# Patient Record
Sex: Female | Born: 1970
Health system: Southern US, Community
[De-identification: ages and names within clinical notes are randomized; demographics above are authoritative.]

## PROBLEM LIST (undated history)

## (undated) DIAGNOSIS — G35 Multiple sclerosis: Secondary | ICD-10-CM

## (undated) HISTORY — PX: CHOLECYSTECTOMY: SHX55

## (undated) HISTORY — PX: TUBAL LIGATION: SHX77

---

## 2016-05-16 ENCOUNTER — Encounter: Payer: Self-pay | Admitting: *Deleted

## 2016-05-16 ENCOUNTER — Emergency Department
Admission: EM | Admit: 2016-05-16 | Discharge: 2016-05-16 | Disposition: A | Discharge status: Home / Self Care | Payer: BLUE CROSS/BLUE SHIELD | Source: Home / Self Care | Attending: Family Medicine | Admitting: Family Medicine

## 2016-05-16 ENCOUNTER — Emergency Department (INDEPENDENT_AMBULATORY_CARE_PROVIDER_SITE_OTHER): Payer: BLUE CROSS/BLUE SHIELD

## 2016-05-16 DIAGNOSIS — S62616A Displaced fracture of proximal phalanx of right little finger, initial encounter for closed fracture: Secondary | ICD-10-CM

## 2016-05-16 DIAGNOSIS — X58XXXA Exposure to other specified factors, initial encounter: Secondary | ICD-10-CM | POA: Diagnosis not present

## 2016-05-16 HISTORY — DX: Multiple sclerosis: G35

## 2016-05-16 NOTE — Discharge Instructions (Signed)
° °  You may have 500mg  acetaminophen (Tylenol) every 4-6 hours (1000mg  for first dose) and ibuprofen 600mg  every 6-8 hours for pain and swelling.  Try to keep hand elevated to help with swelling. You may also apply a cold pack to the splint as the cool temperature can be felt through the splint.  Do NOT get the splint wet.  If it becomes to tight, dirty, or wet, before follow up with Sports Medicine, please call urgent care for assistance.

## 2016-05-16 NOTE — ED Provider Notes (Signed)
CSN: [id number]     Arrival date & time 05/16/16  1506 History   First MD Initiated Contact with Patient 05/16/16 1539     Chief Complaint  Patient presents with  . Finger Injury   (Consider location/radiation/quality/duration/timing/severity/associated sxs/prior Treatment) HPI  Rockwell Alexandriaracy Derrick is a 45 y.o. female presenting to UC with c/o Right little finger pain, swelling, and bruising that started last night after falling due to getting caught up in her dogs leash while walking him.  No other injuries.  Pain is aching and sore, 7/10, worse with palpation and movement. Movement limited due to pain.  She took Tylenol and used ice with minimal relief.  Pt is Right hand dominant.    Past Medical History:  Diagnosis Date  . Multiple sclerosis (HCC)    Past Surgical History:  Procedure Laterality Date  . CHOLECYSTECTOMY    . TUBAL LIGATION     Family History  Problem Relation Age of Onset  . Rheum arthritis Father   . Diverticulitis Father   . COPD Father    Social History  Substance Use Topics  . Smoking status: Never Smoker  . Smokeless tobacco: Never Used  . Alcohol use No   OB History    No data available     Review of Systems  Musculoskeletal: Positive for arthralgias, joint swelling and myalgias.  Skin: Positive for color change. Negative for rash and wound.  Neurological: Positive for numbness. Negative for weakness.    Allergies  Review of patient's allergies indicates no known allergies.  Home Medications   Prior to Admission medications   Not on File   Meds Ordered and Administered this Visit  Medications - No data to display  BP 143/84 (BP Location: Left Arm)   Pulse 74   Temp 98.1 F (36.7 C) (Oral)   Resp 16   Ht 5\' 4"  (1.626 m)   Wt 217 lb (98.4 kg)   LMP 04/26/2016   SpO2 100%   BMI 37.25 kg/m  No data found.   Physical Exam  Constitutional: She is oriented to person, place, and time. She appears well-developed and well-nourished.   HENT:  Head: Normocephalic and atraumatic.  Eyes: EOM are normal.  Neck: Normal range of motion.  Cardiovascular: Normal rate.   Pulses:      Radial pulses are 2+ on the right side.  Pulmonary/Chest: Effort normal.  Musculoskeletal: She exhibits edema and tenderness. She exhibits no deformity.  Right wrist: full ROM, non-tender. Right hand: mild tenderness along 5th metacarpal. Tenderness to proximal 5th phalanx.  Limited ROM, mild to moderate edema over 5th MCP joint. Full ROM fingers 1-4 w/o tenderness.  Neurological: She is alert and oriented to person, place, and time.  Right little finger: slight decreased sensation over proximal phalanx. Sensation in tact at distal phalanx.   Skin: Skin is warm and dry. Capillary refill takes less than 2 seconds.  Right little finger: skin in tact, mild ecchymosis. No erythema or warmth.   Nursing note and vitals reviewed.   Urgent Care Course   Clinical Course    .Splint Application Date/Time: 05/16/2016 5:08 PM Performed by: Junius Finner'MALLEY, Marciana Uplinger Authorized by: Donna ChristenBEESE, STEPHEN A   Consent:    Consent obtained:  Verbal   Consent given by:  Patient   Risks discussed:  Discoloration, numbness, pain and swelling   Alternatives discussed:  Delayed treatment and alternative treatment Pre-procedure details:    Sensation:  Numbness and weakness (slight decreased sensation at proximal phalanx, limited ROM  at 5ht MCP. Senation normal to distal phalanx.)   Skin color:  Skin in tact, ecchymosis to proximal aspect 5th phalanx.  Procedure details:    Laterality:  Right   Location:  Finger   Finger:  R small finger   Strapping: no     Splint type:  Ulnar gutter   Supplies:  Cotton padding, Ortho-Glass and elastic bandage Post-procedure details:    Pain:  Unchanged   Sensation:  Normal   Skin color:  Pink, dry   Patient tolerance of procedure:  Tolerated well, no immediate complications   (including critical care time)  Labs Review Labs Reviewed  - No data to display  Imaging Review Dg Hand Complete Right  Result Date: 05/16/2016 CLINICAL DATA:  Fall 1 day prior EXAM: RIGHT HAND - COMPLETE 3+ VIEW COMPARISON:  None. FINDINGS: Frontal, oblique, and lateral views were obtained. There is an obliquely oriented fracture involving the mid the distal aspects of the fifth proximal phalanx with overall alignment near anatomic. There is soft tissue swelling in this area. No other fractures are evident. No dislocation. Joint spaces appear normal. IMPRESSION: Obliquely oriented fracture mid to distal aspects of the fifth proximal phalanx with alignment overall near anatomic. No other fractures. No dislocations. No apparent arthropathy. Electronically Signed   By: Bretta Bang III M.D.   On: 05/16/2016 15:41    MDM   1. Closed fracture of proximal phalanx of fifth finger of right hand, initial encounter    Pt c/o Right little finger pain, swelling, and bruising after a fall yesterday. No other injuries.  Plain films: significant for fracture mid to distal aspect of fifth proximal phalanx. Non-displaced.  Pt placed in ulnar-gutter splint, home care instructions provided. Encouraged to call Sports Medicine on Monday to schedule f/u appointment in 1-2 weeks for recheck of fracture. Pt declined prescription pain medication at this time.  Patient verbalized understanding and agreement with treatment plan.    Junius Finner, PA-C 05/16/16 1710

## 2016-05-16 NOTE — ED Triage Notes (Signed)
Pt c/o RT 5th finger pain post fall at home yesterday. She took Tylenol and applied ice. Offered tylenol or IBF for pain pt declined. She said she would take some when she got home.

## 2016-05-23 ENCOUNTER — Ambulatory Visit (INDEPENDENT_AMBULATORY_CARE_PROVIDER_SITE_OTHER): Payer: BLUE CROSS/BLUE SHIELD

## 2016-05-23 ENCOUNTER — Ambulatory Visit (INDEPENDENT_AMBULATORY_CARE_PROVIDER_SITE_OTHER): Payer: BLUE CROSS/BLUE SHIELD | Admitting: Family Medicine

## 2016-05-23 ENCOUNTER — Encounter: Payer: Self-pay | Admitting: Family Medicine

## 2016-05-23 VITALS — BP 142/70 | HR 75 | Wt 219.0 lb

## 2016-05-23 DIAGNOSIS — W010XXD Fall on same level from slipping, tripping and stumbling without subsequent striking against object, subsequent encounter: Secondary | ICD-10-CM | POA: Diagnosis not present

## 2016-05-23 DIAGNOSIS — Z23 Encounter for immunization: Secondary | ICD-10-CM

## 2016-05-23 DIAGNOSIS — S62616D Displaced fracture of proximal phalanx of right little finger, subsequent encounter for fracture with routine healing: Secondary | ICD-10-CM

## 2016-05-23 DIAGNOSIS — S62609A Fracture of unspecified phalanx of unspecified finger, initial encounter for closed fracture: Secondary | ICD-10-CM | POA: Insufficient documentation

## 2016-05-23 NOTE — Patient Instructions (Signed)
Thank you for coming in today. Return in 2 weeks for recheck.   Cast or Splint Care Casts and splints support injured limbs and keep bones from moving while they heal. It is important to care for your cast or splint at home.  HOME CARE INSTRUCTIONS  Keep the cast or splint uncovered during the drying period. It can take 24 to 48 hours to dry if it is made of plaster. A fiberglass cast will dry in less than 1 hour.  Do not rest the cast on anything harder than a pillow for the first 24 hours.  Do not put weight on your injured limb or apply pressure to the cast until your health care provider gives you permission.  Keep the cast or splint dry. Wet casts or splints can lose their shape and may not support the limb as well. A wet cast that has lost its shape can also create harmful pressure on your skin when it dries. Also, wet skin can become infected.  Cover the cast or splint with a plastic bag when bathing or when out in the rain or snow. If the cast is on the trunk of the body, take sponge baths until the cast is removed.  If your cast does become wet, dry it with a towel or a blow dryer on the cool setting only.  Keep your cast or splint clean. Soiled casts may be wiped with a moistened cloth.  Do not place any hard or soft foreign objects under your cast or splint, such as cotton, toilet paper, lotion, or powder.  Do not try to scratch the skin under the cast with any object. The object could get stuck inside the cast. Also, scratching could lead to an infection. If itching is a problem, use a blow dryer on a cool setting to relieve discomfort.  Do not trim or cut your cast or remove padding from inside of it.  Exercise all joints next to the injury that are not immobilized by the cast or splint. For example, if you have a long leg cast, exercise the hip joint and toes. If you have an arm cast or splint, exercise the shoulder, elbow, thumb, and fingers.  Elevate your injured arm or  leg on 1 or 2 pillows for the first 1 to 3 days to decrease swelling and pain.It is best if you can comfortably elevate your cast so it is higher than your heart. SEEK MEDICAL CARE IF:   Your cast or splint cracks.  Your cast or splint is too tight or too loose.  You have unbearable itching inside the cast.  Your cast becomes wet or develops a soft spot or area.  You have a bad smell coming from inside your cast.  You get an object stuck under your cast.  Your skin around the cast becomes red or raw.  You have new pain or worsening pain after the cast has been applied. SEEK IMMEDIATE MEDICAL CARE IF:   You have fluid leaking through the cast.  You are unable to move your fingers or toes.  You have discolored (blue or white), cool, painful, or very swollen fingers or toes beyond the cast.  You have tingling or numbness around the injured area.  You have severe pain or pressure under the cast.  You have any difficulty with your breathing or have shortness of breath.  You have chest pain.   This information is not intended to replace advice given to you by your health   care provider. Make sure you discuss any questions you have with your health care provider.   Document Released: 09/05/2000 Document Revised: 06/29/2013 Document Reviewed: 03/17/2013 Elsevier Interactive Patient Education 2016 Elsevier Inc.  

## 2016-05-23 NOTE — Progress Notes (Signed)
   Subjective:    I'm seeing this patient as a consultation for:  Junius Finnerrin O'Malley PA-C  CC: Right fifth finger fracture  HPI: Patient fell on August 24 injuring her right hand. She was seen in urgent care on the 25th diagnosed with a proximal right fifth phalanx fracture and treated with ulnar gutter splinting. She notes mild pain but overall is doing quite well with no fevers chills nausea vomiting or diarrhea.  Past medical history, Surgical history, Family history not pertinant except as noted below, Social history, Allergies, and medications have been entered into the medical record, reviewed, and no changes needed.   Review of Systems: No headache, visual changes, nausea, vomiting, diarrhea, constipation, dizziness, abdominal pain, skin rash, fevers, chills, night sweats, weight loss, swollen lymph nodes, body aches, joint swelling, muscle aches, chest pain, shortness of breath, mood changes, visual or auditory hallucinations.   Objective:    Vitals:   05/23/16 0927  BP: (!) 142/70  Pulse: 75   General: Well Developed, well nourished, and in no acute distress.  Neuro/Psych: Alert and oriented x3, extra-ocular muscles intact, able to move all 4 extremities, sensation grossly intact. Skin: Warm and dry, no rashes noted.  Respiratory: Not using accessory muscles, speaking in full sentences, trachea midline.  Cardiovascular: Pulses palpable, no extremity edema. Abdomen: Does not appear distended. MSK: Right hand: Normal-appearing no significant ecchymosis. Pulses capillary refill and sensation intact distally. No  deformities noted.  X-ray right fifth digit: Stable appearance oblique fracture. No displacement present. Awaiting formal radiology reviewed  Well-formed ulnar gutter cast applied  No results found for this or any previous visit (from the past 24 hour(s)). No results found.  Impression and Recommendations:    Assessment and Plan: 45 y.o. female with Right fifth digit  fracture.   Casted today. Return in 2 weeks for recheck and reevaluation.  Patient was given a flu vaccine prior to discharge.   Discussed warning signs or symptoms. Please see discharge instructions. Patient expresses understanding.

## 2016-06-06 ENCOUNTER — Ambulatory Visit (INDEPENDENT_AMBULATORY_CARE_PROVIDER_SITE_OTHER): Payer: BLUE CROSS/BLUE SHIELD

## 2016-06-06 ENCOUNTER — Ambulatory Visit (INDEPENDENT_AMBULATORY_CARE_PROVIDER_SITE_OTHER): Payer: BLUE CROSS/BLUE SHIELD | Admitting: Family Medicine

## 2016-06-06 ENCOUNTER — Encounter: Payer: Self-pay | Admitting: Family Medicine

## 2016-06-06 VITALS — BP 136/75 | HR 74 | Wt 217.0 lb

## 2016-06-06 DIAGNOSIS — S62609A Fracture of unspecified phalanx of unspecified finger, initial encounter for closed fracture: Secondary | ICD-10-CM | POA: Diagnosis not present

## 2016-06-06 DIAGNOSIS — W19XXXD Unspecified fall, subsequent encounter: Secondary | ICD-10-CM | POA: Diagnosis not present

## 2016-06-06 DIAGNOSIS — S62616D Displaced fracture of proximal phalanx of right little finger, subsequent encounter for fracture with routine healing: Secondary | ICD-10-CM

## 2016-06-06 NOTE — Progress Notes (Signed)
       Ashlee Lyons is a 45 y.o. female who [facility] today for follow-up right fifth digit fracture. Patient was originally seen on August 25 for fracture of the right fifth digit and urgent care. She was placed into a splint and subsequently into a fiberglass cast with me on September 1. In the interim she has done well with minimal pain.   Past Medical History:  Diagnosis Date  . Multiple sclerosis (HCC)    Past Surgical History:  Procedure Laterality Date  . CHOLECYSTECTOMY    . TUBAL LIGATION     Social History  Substance Use Topics  . Smoking status: Never Smoker  . Smokeless tobacco: Never Used  . Alcohol use No   family history includes COPD in her father; Diverticulitis in her father; Rheum arthritis in her father.  ROS as above:  Medications: Current Outpatient Prescriptions  Medication Sig Dispense Refill  . Acetaminophen (TYLENOL) 325 MG CAPS Take by mouth.     No current facility-administered medications for this visit.    No Known Allergies   Exam:  BP 136/75   Pulse 74   Wt 217 lb (98.4 kg)   LMP 04/26/2016   BMI 37.25 kg/m  Gen: Well NAD Right hand: Well appearing non-tender. No rotational deformity seen.   Xray right 5th digit: Slight worsening displacement of the fracture segment compared to previous x-rays. Otherwise well appearing. Awaiting formal radiology review  Patient was placed back into a ulnar gutter cast  No results found for this or any previous visit (from the past 24 hour(s)). No results found.    Assessment and Plan: 45 y.o. female with slightly worsening displaced fifth finger fracture. Continue conservative management and recheck in 1 week. If worsening we will send to hand surgery for evaluation of possible fixation.   Orders Placed This Encounter  Procedures  . DG Finger Little Right   Order Specific Question:   Reason for exam:    Answer:   f/u fx    Order Specific Question:   Is the patient pregnant?    Answer:   No    Order Specific Question:   Preferred imaging location?    [facility]    Discussed warning signs or symptoms. Please see discharge instructions. Patient expresses understanding.

## 2016-06-06 NOTE — Patient Instructions (Signed)
Thank you for coming in today. Return in 1 week.   Cast or Splint Care Casts and splints support injured limbs and keep bones from moving while they heal. It is important to care for your cast or splint at home.  HOME CARE INSTRUCTIONS  Keep the cast or splint uncovered during the drying period. It can take 24 to 48 hours to dry if it is made of plaster. A fiberglass cast will dry in less than 1 hour.  Do not rest the cast on anything harder than a pillow for the first 24 hours.  Do not put weight on your injured limb or apply pressure to the cast until your health care provider gives you permission.  Keep the cast or splint dry. Wet casts or splints can lose their shape and may not support the limb as well. A wet cast that has lost its shape can also create harmful pressure on your skin when it dries. Also, wet skin can become infected.  Cover the cast or splint with a plastic bag when bathing or when out in the rain or snow. If the cast is on the trunk of the body, take sponge baths until the cast is removed.  If your cast does become wet, dry it with a towel or a blow dryer on the cool setting only.  Keep your cast or splint clean. Soiled casts may be wiped with a moistened cloth.  Do not place any hard or soft foreign objects under your cast or splint, such as cotton, toilet paper, lotion, or powder.  Do not try to scratch the skin under the cast with any object. The object could get stuck inside the cast. Also, scratching could lead to an infection. If itching is a problem, use a blow dryer on a cool setting to relieve discomfort.  Do not trim or cut your cast or remove padding from inside of it.  Exercise all joints next to the injury that are not immobilized by the cast or splint. For example, if you have a long leg cast, exercise the hip joint and toes. If you have an arm cast or splint, exercise the shoulder, elbow, thumb, and fingers.  Elevate your injured arm or leg on 1 or 2  pillows for the first 1 to 3 days to decrease swelling and pain.It is best if you can comfortably elevate your cast so it is higher than your heart. SEEK MEDICAL CARE IF:   Your cast or splint cracks.  Your cast or splint is too tight or too loose.  You have unbearable itching inside the cast.  Your cast becomes wet or develops a soft spot or area.  You have a bad smell coming from inside your cast.  You get an object stuck under your cast.  Your skin around the cast becomes red or raw.  You have new pain or worsening pain after the cast has been applied. SEEK IMMEDIATE MEDICAL CARE IF:   You have fluid leaking through the cast.  You are unable to move your fingers or toes.  You have discolored (blue or white), cool, painful, or very swollen fingers or toes beyond the cast.  You have tingling or numbness around the injured area.  You have severe pain or pressure under the cast.  You have any difficulty with your breathing or have shortness of breath.  You have chest pain.   This information is not intended to replace advice given to you by your health care provider.   Make sure you discuss any questions you have with your health care provider.   Document Released: 09/05/2000 Document Revised: 06/29/2013 Document Reviewed: 03/17/2013 Elsevier Interactive Patient Education 2016 Elsevier Inc.  

## 2016-06-13 ENCOUNTER — Ambulatory Visit (INDEPENDENT_AMBULATORY_CARE_PROVIDER_SITE_OTHER): Payer: BLUE CROSS/BLUE SHIELD | Admitting: Family Medicine

## 2016-06-13 ENCOUNTER — Encounter: Payer: Self-pay | Admitting: Family Medicine

## 2016-06-13 ENCOUNTER — Ambulatory Visit (INDEPENDENT_AMBULATORY_CARE_PROVIDER_SITE_OTHER): Payer: BLUE CROSS/BLUE SHIELD

## 2016-06-13 VITALS — BP 130/84 | HR 72 | Wt 216.0 lb

## 2016-06-13 DIAGNOSIS — S62616D Displaced fracture of proximal phalanx of right little finger, subsequent encounter for fracture with routine healing: Secondary | ICD-10-CM

## 2016-06-13 DIAGNOSIS — W19XXXD Unspecified fall, subsequent encounter: Secondary | ICD-10-CM

## 2016-06-13 DIAGNOSIS — S62609A Fracture of unspecified phalanx of unspecified finger, initial encounter for closed fracture: Secondary | ICD-10-CM | POA: Diagnosis not present

## 2016-06-13 NOTE — Patient Instructions (Signed)
Thank you for coming in today. Return in 1 1/2 weeks.

## 2016-06-13 NOTE — Progress Notes (Signed)
       Ashlee Lyons is a 45 y.o. female who [facility] today for a fracture. Patient returns to clinic to follow-up right finger fracture. She's done well over the last week with no pain.   Past Medical History:  Diagnosis Date  . Multiple sclerosis (HCC)    Past Surgical History:  Procedure Laterality Date  . CHOLECYSTECTOMY    . TUBAL LIGATION     Social History  Substance Use Topics  . Smoking status: Never Smoker  . Smokeless tobacco: Never Used  . Alcohol use No   family history includes COPD in her father; Diverticulitis in her father; Rheum arthritis in her father.  ROS as above:  Medications: Current Outpatient Prescriptions  Medication Sig Dispense Refill  . Acetaminophen (TYLENOL) 325 MG CAPS Take by mouth.     No current facility-administered medications for this visit.    No Known Allergies   Exam:  BP 130/84   Pulse 72   Wt 216 lb (98 kg)   LMP 04/26/2016   BMI 37.08 kg/m  Gen: Well NAD Right hand well-appearing no ecchymosis minimally tender right fifth digit.   A well-formed on her daughter cast was placed  No results found for this or any previous visit (from the past 24 hour(s)). Dg Finger Little Right  Result Date: 06/13/2016 CLINICAL DATA:  Followup RIGHT little finger fracture EXAM: RIGHT LITTLE FINGER 2+V COMPARISON:  06/06/2016 FINDINGS: Osseous mineralization normal. Soft tissue swelling RIGHT little finger. Oblique fracture through head and distal shaft of proximal phalanx RIGHT little finger, minimally displaced. This extends to the articular margin at the PIP joint. Minimal periosteal new bone no identified. No additional fracture, dislocation, or bone destruction. IMPRESSION: Healing fracture at proximal phalanx RIGHT little finger as above. Electronically Signed   By: Ulyses SouthwardMark  Boles M.D.   On: 06/13/2016 12:39       Assessment and Plan: 45 y.o. female with healing minimally displaced fracture of the right fifth proximal phalanx. No further displacement in the interval. Continue ulnar gutter casting. Recheck in a week and half.   Orders Placed This Encounter  Procedures  . DG Finger Little Right    Order Specific Question:   Reason for exam:    Answer:   eval fx    Order Specific Question:   Is the patient pregnant?    Answer:   No    Order Specific Question:   Preferred imaging location?    [facility]    Discussed warning signs or symptoms. Please see discharge instructions. Patient expresses understanding.

## 2016-06-17 ENCOUNTER — Ambulatory Visit (INDEPENDENT_AMBULATORY_CARE_PROVIDER_SITE_OTHER): Payer: BLUE CROSS/BLUE SHIELD | Admitting: Family Medicine

## 2016-06-17 ENCOUNTER — Encounter: Payer: Self-pay | Admitting: Family Medicine

## 2016-06-17 VITALS — BP 121/67 | HR 75 | Wt 214.0 lb

## 2016-06-17 DIAGNOSIS — Z4789 Encounter for other orthopedic aftercare: Secondary | ICD-10-CM

## 2016-06-17 DIAGNOSIS — S62609A Fracture of unspecified phalanx of unspecified finger, initial encounter for closed fracture: Secondary | ICD-10-CM

## 2016-06-17 NOTE — Patient Instructions (Signed)
Thank you for coming in today.   Cast or Splint Care Casts and splints support injured limbs and keep bones from moving while they heal.  HOME CARE  Keep the cast or splint uncovered during the drying period.  A plaster cast can take 24 to 48 hours to dry.  A fiberglass cast will dry in less than 1 hour.  Do not rest the cast on anything harder than a pillow for 24 hours.  Do not put weight on your injured limb. Do not put pressure on the cast. Wait for your doctor's approval.  Keep the cast or splint dry.  Cover the cast or splint with a plastic bag during baths or wet weather.  If you have a cast over your chest and belly (trunk), take sponge baths until the cast is taken off.  If your cast gets wet, dry it with a towel or blow dryer. Use the cool setting on the blow dryer.  Keep your cast or splint clean. Wash a dirty cast with a damp cloth.  Do not put any objects under your cast or splint.  Do not scratch the skin under the cast with an object. If itching is a problem, use a blow dryer on a cool setting over the itchy area.  Do not trim or cut your cast.  Do not take out the padding from inside your cast.  Exercise your joints near the cast as told by your doctor.  Raise (elevate) your injured limb on 1 or 2 pillows for the first 1 to 3 days. GET HELP IF:  Your cast or splint cracks.  Your cast or splint is too tight or too loose.  You itch badly under the cast.  Your cast gets wet or has a soft spot.  You have a bad smell coming from the cast.  You get an object stuck under the cast.  Your skin around the cast becomes red or sore.  You have new or more pain after the cast is put on. GET HELP RIGHT AWAY IF:  You have fluid leaking through the cast.  You cannot move your fingers or toes.  Your fingers or toes turn blue or white or are cool, painful, or puffy (swollen).  You have tingling or lose feeling (numbness) around the injured area.  You have  bad pain or pressure under the cast.  You have trouble breathing or have shortness of breath.  You have chest pain.   This information is not intended to replace advice given to you by your health care provider. Make sure you discuss any questions you have with your health care provider.   Document Released: 01/08/2011 Document Revised: 05/11/2013 Document Reviewed: 03/17/2013 Elsevier Interactive Patient Education [employer]2016 Elsevier Inc.

## 2016-06-17 NOTE — Progress Notes (Signed)
       Ashlee Lyons is a 45 y.o. female who [facility] today for cast pain. Patient is currently being treated with right-sided fifth digit fracture. A cast was placed on September 22. She notes the cast is irritating her and causing pain.   Past Medical History:  Diagnosis Date  . Multiple sclerosis (HCC)    Past Surgical History:  Procedure Laterality Date  . CHOLECYSTECTOMY    . TUBAL LIGATION     Social History  Substance Use Topics  . Smoking status: Never Smoker  . Smokeless tobacco: Never Used  . Alcohol use No   family history includes COPD in her father; Diverticulitis in her father; Rheum arthritis in her father.  ROS as above:  Medications: Current Outpatient Prescriptions  Medication Sig Dispense Refill  . Acetaminophen (TYLENOL) 325 MG CAPS Take by mouth.     No current facility-administered medications for this visit.    No Known Allergies   Exam:  BP 121/67   Pulse 75   Wt 214 lb (97.1 kg)   BMI 36.73 kg/m  Gen: Well NAD Right arm: Slight skin erythema at the proximal forearm where it is being rubbed by a cast edge.  Otherwise no skin changes. Arm and hand are nontender.  The ulnar gutter cast was removed and replaced with a new ulnar gutter cast  No results found for this or any previous visit (from the past 24 hour(s)). No results found.    Assessment and Plan: 45 y.o. female with asked irritation. Continue casting. Recheck in about a week and a half.   No orders of the defined types were placed in this encounter.   Discussed warning signs or symptoms. Please see discharge instructions. Patient expresses understanding.

## 2016-06-25 ENCOUNTER — Ambulatory Visit (INDEPENDENT_AMBULATORY_CARE_PROVIDER_SITE_OTHER): Payer: BLUE CROSS/BLUE SHIELD | Admitting: Family Medicine

## 2016-06-25 ENCOUNTER — Ambulatory Visit (INDEPENDENT_AMBULATORY_CARE_PROVIDER_SITE_OTHER): Payer: BLUE CROSS/BLUE SHIELD

## 2016-06-25 VITALS — BP 136/87 | HR 82

## 2016-06-25 DIAGNOSIS — S62646D Nondisplaced fracture of proximal phalanx of right little finger, subsequent encounter for fracture with routine healing: Secondary | ICD-10-CM

## 2016-06-25 DIAGNOSIS — S62646A Nondisplaced fracture of proximal phalanx of right little finger, initial encounter for closed fracture: Secondary | ICD-10-CM

## 2016-06-25 DIAGNOSIS — X58XXXD Exposure to other specified factors, subsequent encounter: Secondary | ICD-10-CM

## 2016-06-25 NOTE — Patient Instructions (Signed)
Thank you for coming in today. Get labs to check calcium and vitamin D.  Return in 2 weeks or sooner if needed.

## 2016-06-26 LAB — COMPLETE METABOLIC PANEL WITH GFR
ALT: 13 U/L (ref 6–29)
AST: 13 U/L (ref 10–35)
Albumin: 4.1 g/dL (ref 3.6–5.1)
Alkaline Phosphatase: 67 U/L (ref 33–115)
BUN: 11 mg/dL (ref 7–25)
CHLORIDE: 103 mmol/L (ref 98–110)
CO2: 23 mmol/L (ref 20–31)
CREATININE: 0.87 mg/dL (ref 0.50–1.10)
Calcium: 9.2 mg/dL (ref 8.6–10.2)
GFR, Est Non African American: 80 mL/min (ref >=60)
Glucose, Bld: 86 mg/dL (ref 65–99)
Potassium: 4.4 mmol/L (ref 3.5–5.3)
Sodium: 137 mmol/L (ref 135–146)
Total Bilirubin: 0.5 mg/dL (ref 0.2–1.2)
Total Protein: 6.7 g/dL (ref 6.1–8.1)

## 2016-06-26 NOTE — Progress Notes (Signed)
       Ashlee Lyons is a 45 y.o. female who [facility] today for follow-up fifth finger fracture. Patient has been immobilized adequately now for about 4 weeks. She denies any pain. She feels well no fevers or chills.   Past Medical History:  Diagnosis Date  . Multiple sclerosis (HCC)    Past Surgical History:  Procedure Laterality Date  . CHOLECYSTECTOMY    . TUBAL LIGATION     Social History  Substance Use Topics  . Smoking status: Never Smoker  . Smokeless tobacco: Never Used  . Alcohol use No   family history includes COPD in her father; Diverticulitis in her father; Rheum arthritis in her father.  ROS as above:  Medications: Current Outpatient Prescriptions  Medication Sig Dispense Refill  . Acetaminophen (TYLENOL) 325 MG CAPS Take by mouth.     No current facility-administered medications for this visit.    No Known Allergies   Exam:  BP 136/87   Pulse 82   LMP 06/11/2016  Gen: Well NAD Right hand: Well-appearing was no deformities fifth digit. Nontender.  X-ray right fifth digit minimal healing present on x-ray. No further angulation or deformity present. Awaiting formal radiology review  Short arm ulnar gutter cast was applied.   No results found for this or any previous visit (from the past 24 hour(s)). No results found.    Assessment and Plan: 45 y.o. female with right fifth digit proximal phalanx fracture. Clinically doing pretty well but not adequately heal on x-ray. Patient was not adequately mobilized the first week of her fracture I believe. She's only been immobilized truly for about 4 weeks. Plan to recast ulnar gutter cast and recheck in 2 weeks.   Orders Placed This Encounter  Procedures  . DG Finger Little Right    Order Specific Question:   Reason for exam:    Answer:   f/u fx    Order Specific Question:    Is the patient pregnant?    Answer:   No    Order Specific Question:   Preferred imaging location?    [facility]  . COMPLETE METABOLIC PANEL WITH GFR  . VITAMIN D 25 Hydroxy (Vit-D Deficiency, Fractures)    Discussed warning signs or symptoms. Please see discharge instructions. Patient expresses understanding.

## 2016-06-27 LAB — VITAMIN D 25 HYDROXY (VIT D DEFICIENCY, FRACTURES): Vit D, 25-Hydroxy: 23 ng/mL — ABNORMAL LOW (ref 30–100)

## 2016-07-09 ENCOUNTER — Ambulatory Visit (INDEPENDENT_AMBULATORY_CARE_PROVIDER_SITE_OTHER): Payer: BLUE CROSS/BLUE SHIELD | Admitting: Family Medicine

## 2016-07-09 ENCOUNTER — Ambulatory Visit (INDEPENDENT_AMBULATORY_CARE_PROVIDER_SITE_OTHER): Payer: BLUE CROSS/BLUE SHIELD

## 2016-07-09 ENCOUNTER — Encounter: Payer: Self-pay | Admitting: Family Medicine

## 2016-07-09 VITALS — BP 144/96 | HR 92 | Wt 215.0 lb

## 2016-07-09 DIAGNOSIS — S62616D Displaced fracture of proximal phalanx of right little finger, subsequent encounter for fracture with routine healing: Secondary | ICD-10-CM | POA: Diagnosis not present

## 2016-07-09 DIAGNOSIS — W19XXXD Unspecified fall, subsequent encounter: Secondary | ICD-10-CM

## 2016-07-09 DIAGNOSIS — S62646A Nondisplaced fracture of proximal phalanx of right little finger, initial encounter for closed fracture: Secondary | ICD-10-CM

## 2016-07-09 NOTE — Patient Instructions (Signed)
Thank you for coming in today. Continue buddy tape.  Return in 3 weeks for recheck.  Return sooner if worse pain.   Buddy Taping You have a minor finger or toe injury. It can be managed by buddy taping. Buddy taping means the injured finger or toe is taped to a healthy uninjured adjacent finger or toe. Most minor fractures and dislocations of the smaller fingers and toes will heal in 3 to 4 weeks. Buddy taping immobilizes and protects the area of injury. Buddy taping is not recommended for initial treatment of fractures of the thumb, longer fingers, or the great toe. Buddy taping should not be used for unstable or deformed fractures, but as fracture healing progresses it may be used for protection during rehabilitation. Fractured fingers and toes should be protected by buddy taping as long as the injury is still painful or swollen.  When an injury is buddy taped, place a small piece of gauze or cotton between the digits that are taped. This helps prevent the skin from breaking down from increased moisture. Buddy taping allows you to get your injury wet when you bathe. Change the gauze and tape more often if it gets wet, and dry the space between the finger or toes. Use a sturdy, hard-soled shoe for better support if you have a fractured toe. In 2 to 3 weeks you can start motion exercises. This will keep the fingers or toes from becoming stiff.  SEEK IMMEDIATE MEDICAL CARE IF:   The injured area becomes cold, numb, or pale.  You have pain not controlled with medications.  You notice increasing deformity of the toe or finger.   This information is not intended to replace advice given to you by your health care provider. Make sure you discuss any questions you have with your health care provider.   Document Released: 10/16/2004 Document Revised: 09/29/2014 Document Reviewed: 01/31/2015 Elsevier Interactive Patient Education [employer]2016 Elsevier Inc.

## 2016-07-10 NOTE — Progress Notes (Signed)
   Rockwell Alexandriaracy Scheier is a 45 y.o. female who [facility] today for follow-up right finger fracture. Patient was originally seen in late August for fracture of her right fifth digit at the PIP. She's been immobilized since with ulnar gutter splint and casting. She denies any pain but does note some stiffness. No fevers or chills.   Past Medical History:  Diagnosis Date  . Multiple sclerosis (HCC)    Past Surgical History:  Procedure Laterality Date  . CHOLECYSTECTOMY    . TUBAL LIGATION     Social History  Substance Use Topics  . Smoking status: Never Smoker  . Smokeless tobacco: Never Used  . Alcohol use No     ROS:  As above   Medications: Current Outpatient Prescriptions  Medication Sig Dispense Refill  . Acetaminophen (TYLENOL) 325 MG CAPS Take by mouth.    Marland Kitchen. VITAMIN D, CHOLECALCIFEROL, PO Take by mouth.     No current facility-administered medications for this visit.    No Known Allergies   Exam:  BP (!) 144/96   Pulse 92   Wt 215 lb (97.5 kg)   LMP 07/01/2016   BMI 36.90 kg/m  General: Well Developed, well nourished, and in no acute distress.  Neuro/Psych: Alert and oriented x3, extra-ocular muscles intact, able to move all 4 extremities, sensation grossly intact. Skin: Warm and dry, no rashes noted.  Respiratory: Not using accessory muscles, speaking in full sentences, trachea midline.  Cardiovascular: Pulses palpable, no extremity edema. Abdomen: Does not appear distended. MSK: Right hand is well-appearing with no deformity. Nontender.  X-ray shows continued visible fracture line at the proximal fifth phalanx. Nondisplaced. Minimal periosteal reaction present.    No results found for this or any previous visit (from the past 48 hour(s)). No results found.    Assessment and Plan: 45 y.o. female with slow to heal right fifth phalanx fracture. At this time clinically she is doing excellently.  However the fracture is slow to heal on x-ray. Plan to transition to buddy tape and recheck in about 3 weeks. Vitamin D previously assessed and normal. Continued vitamin D supplementation. It's possible that her history of multiple sclerosis is interfering with her delaying the healing of this fracture.    Orders Placed This Encounter  Procedures  . DG Finger Little Right    Standing Status:   Future    Number of Occurrences:   1    Standing Expiration Date:   09/08/2017    Order Specific Question:   Reason for Exam (SYMPTOM  OR DIAGNOSIS REQUIRED)    Answer:   f/u fx    Order Specific Question:   Is patient pregnant?    Answer:   No    Order Specific Question:   Preferred imaging location?    Answer[facility]    Discussed warning signs or symptoms. Please see discharge instructions. Patient expresses understanding.

## 2016-07-31 ENCOUNTER — Ambulatory Visit (INDEPENDENT_AMBULATORY_CARE_PROVIDER_SITE_OTHER): Payer: BLUE CROSS/BLUE SHIELD

## 2016-07-31 ENCOUNTER — Encounter: Payer: Self-pay | Admitting: Family Medicine

## 2016-07-31 ENCOUNTER — Ambulatory Visit (INDEPENDENT_AMBULATORY_CARE_PROVIDER_SITE_OTHER): Payer: BLUE CROSS/BLUE SHIELD | Admitting: Family Medicine

## 2016-07-31 VITALS — BP 132/85 | HR 69

## 2016-07-31 DIAGNOSIS — S62646A Nondisplaced fracture of proximal phalanx of right little finger, initial encounter for closed fracture: Secondary | ICD-10-CM

## 2016-07-31 DIAGNOSIS — W19XXXD Unspecified fall, subsequent encounter: Secondary | ICD-10-CM

## 2016-07-31 NOTE — Progress Notes (Signed)
       Ashlee Lyons is a 45 y.o. female who [facility]: [facility] today for follow-up right fifth digit fracture. Patient has been seen several times since September for proximal phalanx fracture. Over the last several weeks she's been using buddy tape and feels better. She denies significant pain but does note some stiffness.   Past Medical History:  Diagnosis Date  . Multiple sclerosis (HCC)    Past Surgical History:  Procedure Laterality Date  . CHOLECYSTECTOMY    . TUBAL LIGATION     Social History  Substance Use Topics  . Smoking status: Never Smoker  . Smokeless tobacco: Never Used  . Alcohol use No   family history includes COPD in her father; Diverticulitis in her father; Rheum arthritis in her father.  ROS as above:  Medications: Current Outpatient Prescriptions  Medication Sig Dispense Refill  . Acetaminophen (TYLENOL) 325 MG CAPS Take by mouth.    Marland Kitchen. VITAMIN D, CHOLECALCIFEROL, PO Take by mouth.     No current facility-administered medications for this visit.    No Known Allergies  Health Maintenance Health Maintenance  Topic Date Due  . HIV Screening  08/06/1984  . TETANUS/TDAP  08/06/1988  . PAP SMEAR  08/06/1990  . INFLUENZA VACCINE  Completed     Exam:  BP 132/85   Pulse 69   LMP 07/01/2016  Gen: Well NAD Right hand normal-appearing nontender. Slight decreased flexion with stiffness present.  No results found for this or any previous visit (from the past 72 hour(s)). Dg Finger Little Right  Result Date: 07/31/2016 CLINICAL DATA:  Follow-up fracture. EXAM: RIGHT LITTLE FINGER 2+V COMPARISON:  07/09/2016 . FINDINGS: Oblique fracture of the proximal phalanx of the right fifth digit noted. No change from prior exam. No prominent callus formation noted. IMPRESSION: Oblique fracture of the proximal phalanx of the right fifth digit noted.  No change from prior exam. Electronically Signed   By: Maisie Fushomas  Register   On: 07/31/2016 09:44      Assessment and Plan: 45 y.o. female with right fifth digit fracture. Clinically doing well but less than adequate bone healing on x-ray. Plan to continue buddy tape for 1 month and recheck x-ray. At that time I will have been 12 weeks and we can make a determination on fibrous union treatment if needed.   Orders Placed This Encounter  Procedures  . DG Finger Little Right    Order Specific Question:   Reason for exam:    Answer:   f/u fx    Order Specific Question:   Is the patient pregnant?    Answer:   No    Order Specific Question:   Preferred imaging location?    [facility]    Discussed warning signs or symptoms. Please see discharge instructions. Patient expresses understanding.

## 2016-07-31 NOTE — Patient Instructions (Signed)
Thank you for coming in today. Continue buddy tape.  Recheck in 3-4 weeks.  At home when awake work on hand motion with the buddy tape off.   Return sooner if worse.

## 2016-08-28 ENCOUNTER — Ambulatory Visit (INDEPENDENT_AMBULATORY_CARE_PROVIDER_SITE_OTHER): Payer: BLUE CROSS/BLUE SHIELD

## 2016-08-28 ENCOUNTER — Ambulatory Visit (INDEPENDENT_AMBULATORY_CARE_PROVIDER_SITE_OTHER): Payer: BLUE CROSS/BLUE SHIELD | Admitting: Family Medicine

## 2016-08-28 ENCOUNTER — Encounter: Payer: Self-pay | Admitting: Family Medicine

## 2016-08-28 VITALS — BP 143/71 | HR 76 | Ht 64.0 in | Wt 210.0 lb

## 2016-08-28 DIAGNOSIS — W19XXXD Unspecified fall, subsequent encounter: Secondary | ICD-10-CM

## 2016-08-28 DIAGNOSIS — S62646A Nondisplaced fracture of proximal phalanx of right little finger, initial encounter for closed fracture: Secondary | ICD-10-CM | POA: Diagnosis not present

## 2016-08-28 DIAGNOSIS — S62646D Nondisplaced fracture of proximal phalanx of right little finger, subsequent encounter for fracture with routine healing: Secondary | ICD-10-CM

## 2016-08-28 NOTE — Patient Instructions (Signed)
Thank you for coming in today. Attend Hand Therapy.  Recheck in 2 months.

## 2016-08-28 NOTE — Progress Notes (Signed)
   Ashlee Lyons is a 45 y.o. female who [facility] today for follow-up fracture. Patient is been seen many times for fracture of the right digit. Clinically she is doing well however there has been delayed healing on x-ray. She notes she noted has any pain but does have significant stiffness of the PIP.   Past Medical History:  Diagnosis Date  . Multiple sclerosis (HCC)    Past Surgical History:  Procedure Laterality Date  . CHOLECYSTECTOMY    . TUBAL LIGATION     Social History  Substance Use Topics  . Smoking status: Never Smoker  . Smokeless tobacco: Never Used  . Alcohol use No     ROS:  As above   Medications: Current Outpatient Prescriptions  Medication Sig Dispense Refill  . Acetaminophen (TYLENOL) 325 MG CAPS Take by mouth.    Marland Kitchen. VITAMIN D, CHOLECALCIFEROL, PO Take by mouth.     No current facility-administered medications for this visit.    No Known Allergies   Exam:  BP (!) 143/71   Pulse 76   Ht 5\' 4"  (1.626 m)   Wt 210 lb (95.3 kg)   BMI 36.05 kg/m  General: Well Developed, well nourished, and in no acute distress.  Neuro/Psych: Alert and oriented x3, extra-ocular muscles intact, able to move all 4 extremities, sensation grossly intact. Skin: Warm and dry, no rashes noted.  Respiratory: Not using accessory muscles, speaking in full sentences, trachea midline.  Cardiovascular: Pulses palpable, no extremity edema. Abdomen: Does not appear distended. MSK: Well appearing fifth digit right hand. Significant limitation of motion of the PIP. Nontender.    No results found for this or any previous visit (from the past 48 hour(s)). Dg Finger Little Right  Result Date: 08/28/2016 CLINICAL DATA:  History of fracture of the proximal phalanx of the right fifth digit, followup EXAM: RIGHT LITTLE FINGER 2+V COMPARISON:  Right fifth finger films of 07/31/2016 and 05/16/2016 FINDINGS: The oblique  nondisplaced fracture through the proximal phalanx of the right fifth digit is less distinct most likely indicating some interval healing. No acute abnormality is seen. Joint spaces appear normal. IMPRESSION: Some interval healing of the oblique fracture through the proximal phalanx of the right fifth digit. Electronically Signed   By: Dwyane DeePaul  Barry M.D.   On: 08/28/2016 09:51      Assessment and Plan: 45 y.o. female with  Delayed healing right fifth digit fracture. Slight improvement on x-ray today. Patient continues to have stiffness however. Will refer to hand surgery and recheck in about 2 months.    Orders Placed This Encounter  Procedures  . DG Finger Little Right    Order Specific Question:   Reason for exam:    Answer:   f/u fx    Order Specific Question:   Is the patient pregnant?    Answer:   No    Order Specific Question:   Preferred imaging location?    [facility]  . Ambulatory referral to Occupational Therapy    Referral Priority:   Routine    Referral Type:   Occupational Therapy    Referral Reason:   Specialty Services Required    Requested Specialty:   Occupational Therapy    Number of Visits Requested:   1    Discussed warning signs or symptoms. Please see discharge instructions. Patient expresses understanding.

## 2016-10-29 ENCOUNTER — Ambulatory Visit: Payer: BLUE CROSS/BLUE SHIELD | Admitting: Family Medicine

## 2017-07-15 DIAGNOSIS — L237 Allergic contact dermatitis due to plants, except food: Secondary | ICD-10-CM | POA: Diagnosis not present

## 2017-07-15 DIAGNOSIS — Z23 Encounter for immunization: Secondary | ICD-10-CM | POA: Diagnosis not present

## 2017-10-28 DIAGNOSIS — H5203 Hypermetropia, bilateral: Secondary | ICD-10-CM | POA: Diagnosis not present

## 2017-11-13 IMAGING — DX DG HAND COMPLETE 3+V*R*
3 series · 3 of 3 positions shown · non-contrast
Comparison: None.

CLINICAL DATA: Fall 1 day prior

EXAM:
RIGHT HAND - COMPLETE 3+ VIEW

[hand pa]
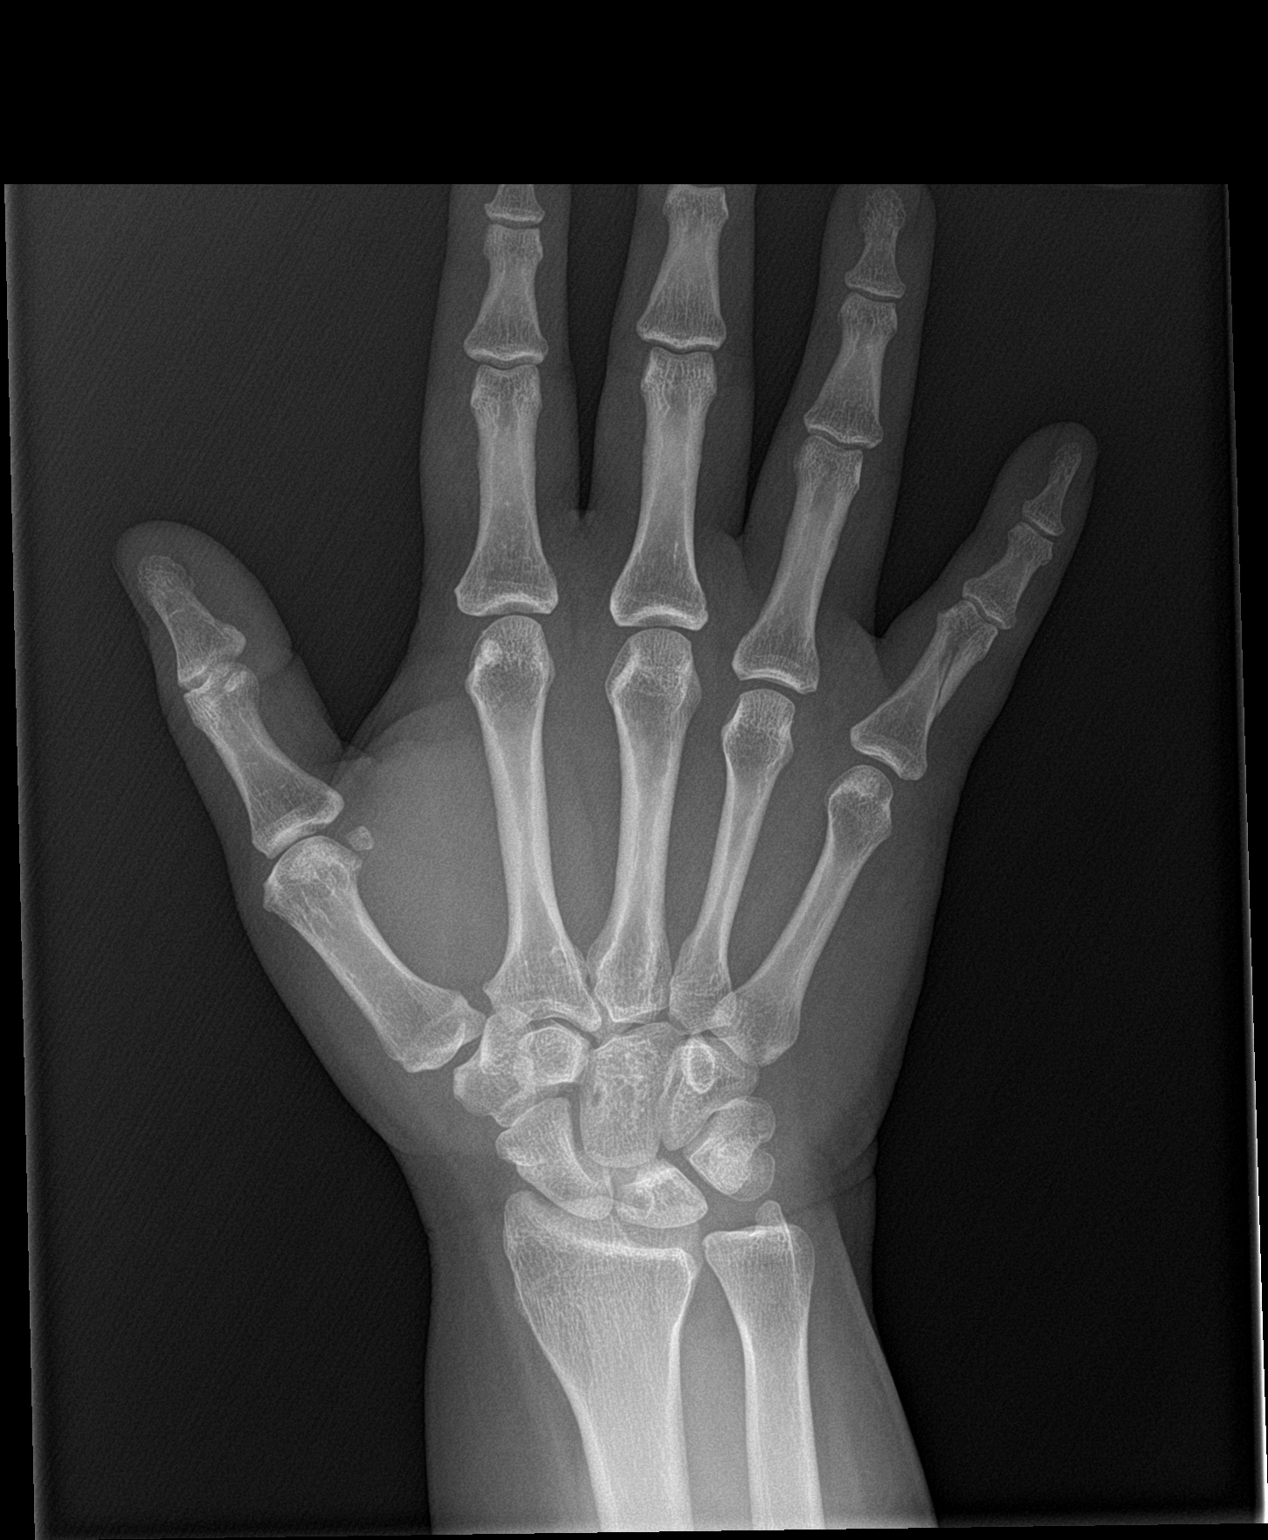

[hand obl]
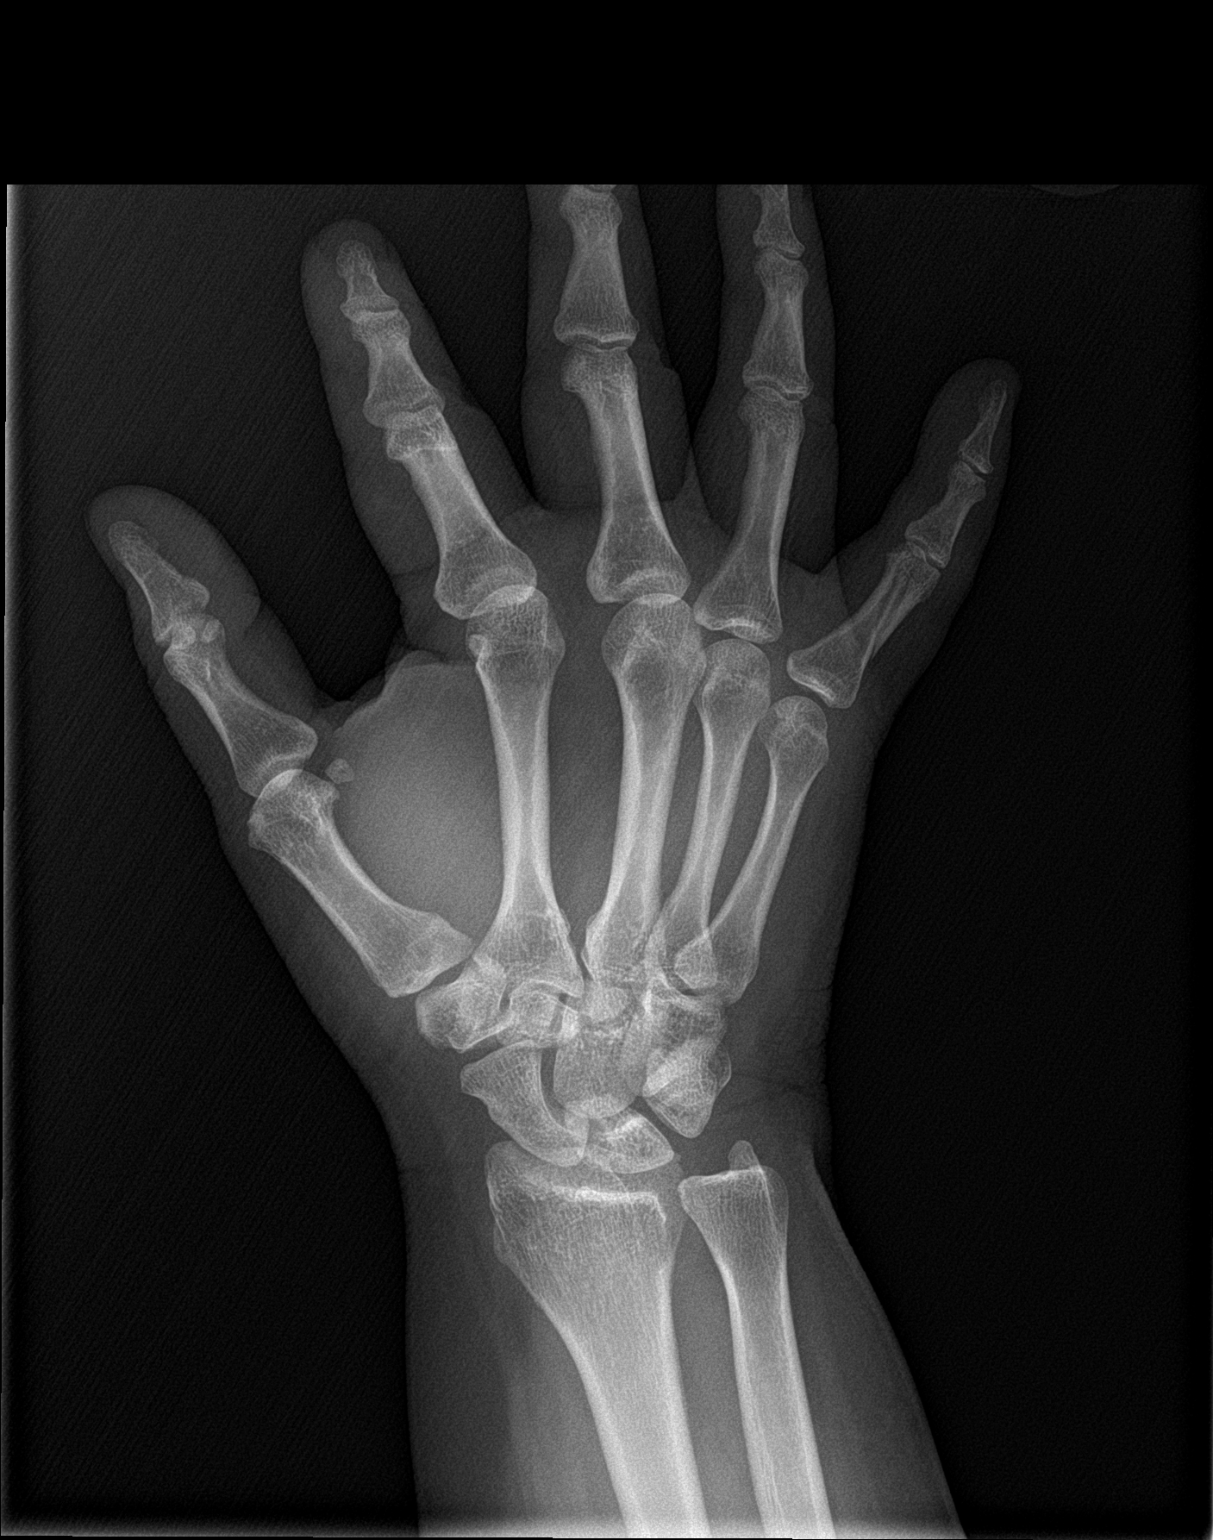

[hand lat]
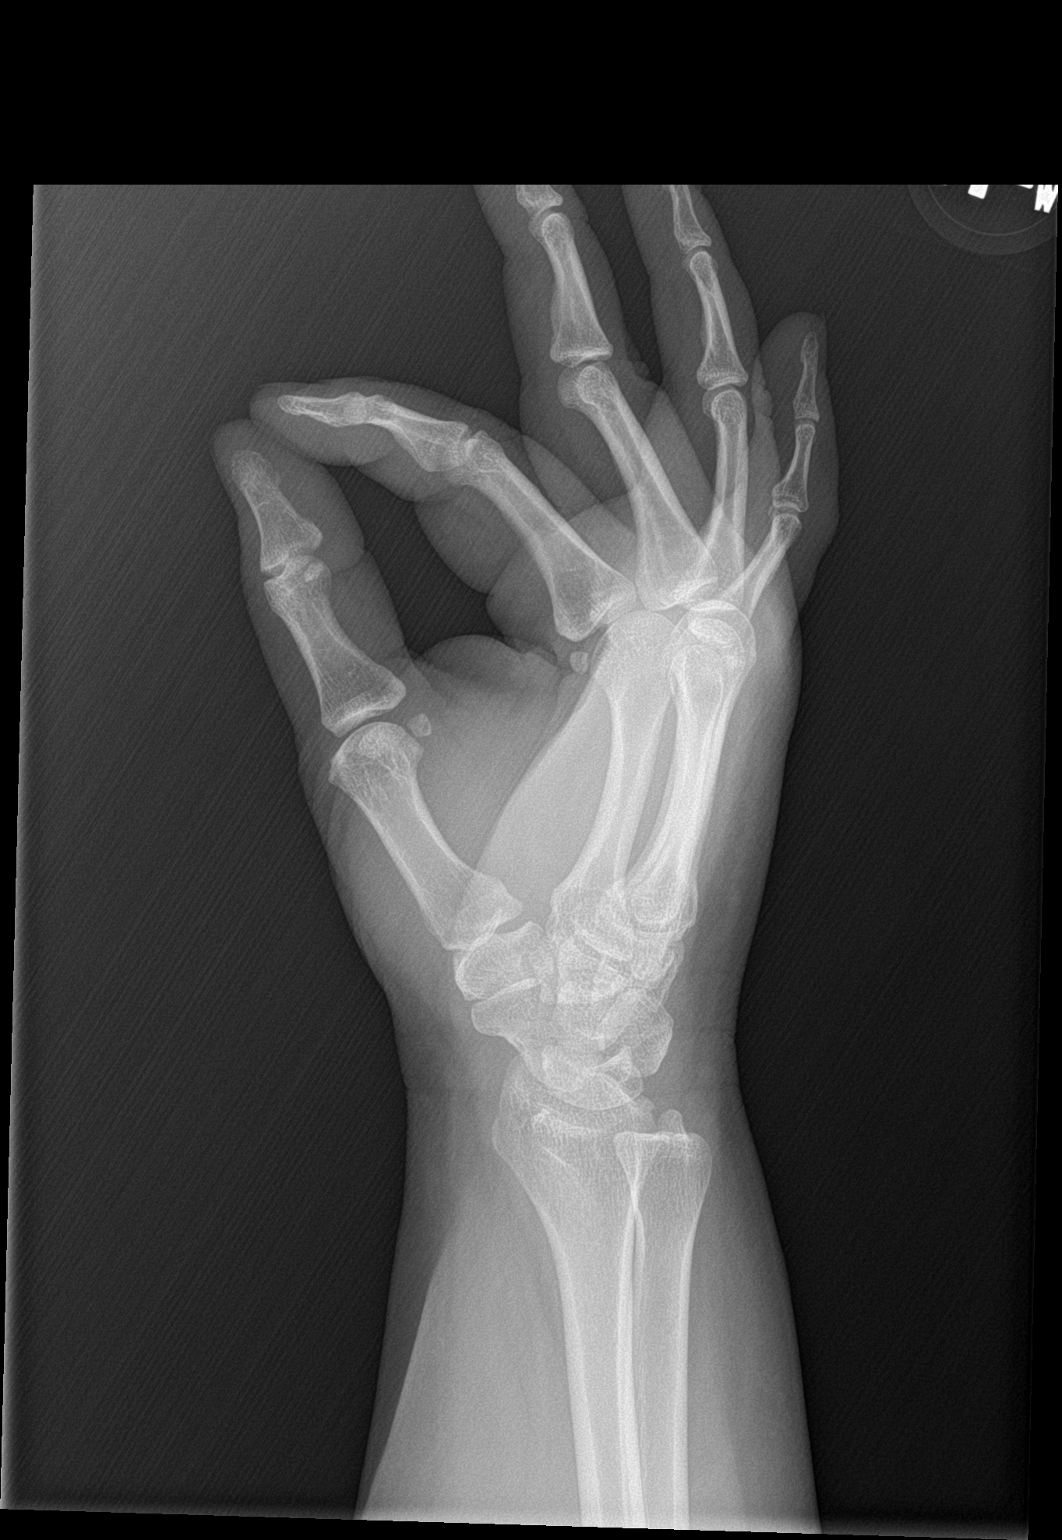

[3 of 3 positions shown; findings below may reference images not displayed]

FINDINGS: Frontal, oblique, and lateral views were obtained. There is an
obliquely oriented fracture involving the mid the distal aspects of
the fifth proximal phalanx with overall alignment near anatomic.
There is soft tissue swelling in this area. No other fractures are
evident. No dislocation. Joint spaces appear normal.
IMPRESSION: Obliquely oriented fracture mid to distal aspects of the fifth
proximal phalanx with alignment overall near anatomic. No other
fractures. No dislocations. No apparent arthropathy.

## 2017-12-04 IMAGING — DX DG FINGER LITTLE 2+V*R*
3 series · 3 of 3 positions shown · non-contrast
Comparison: 05/23/2016

CLINICAL DATA: Follow-up right little finger fracture, injury 3
weeks ago. Subsequent encounter

EXAM:
RIGHT LITTLE FINGER 2+V

[finger ap]
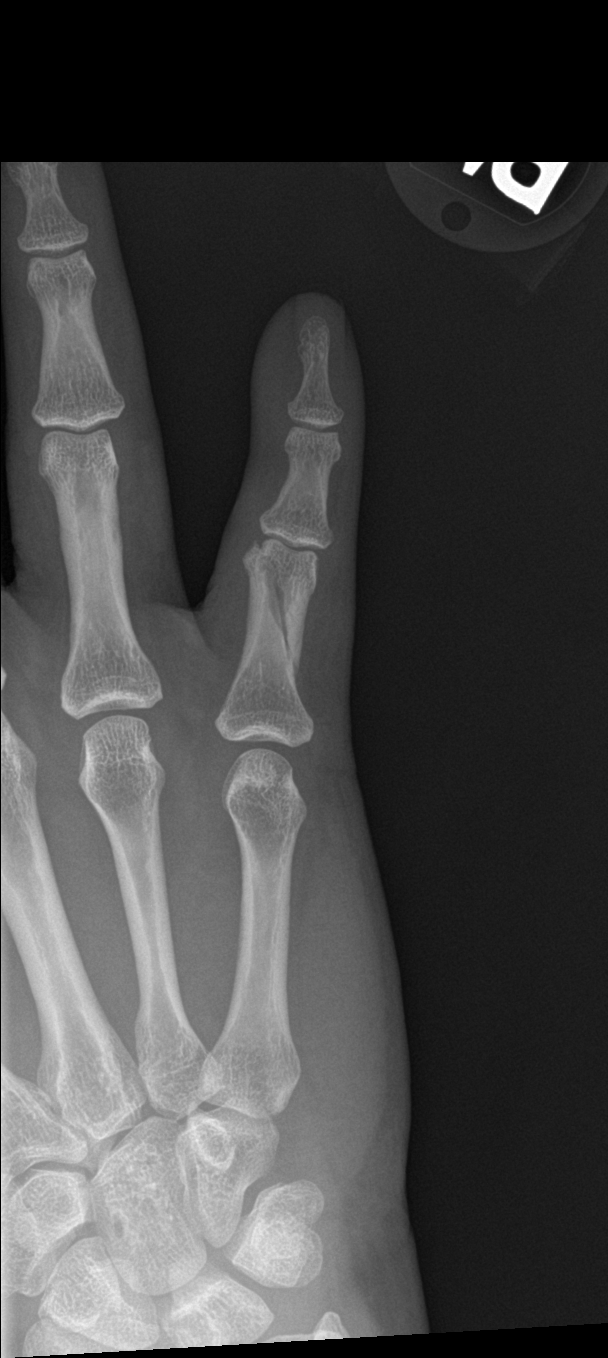

[finger obl]
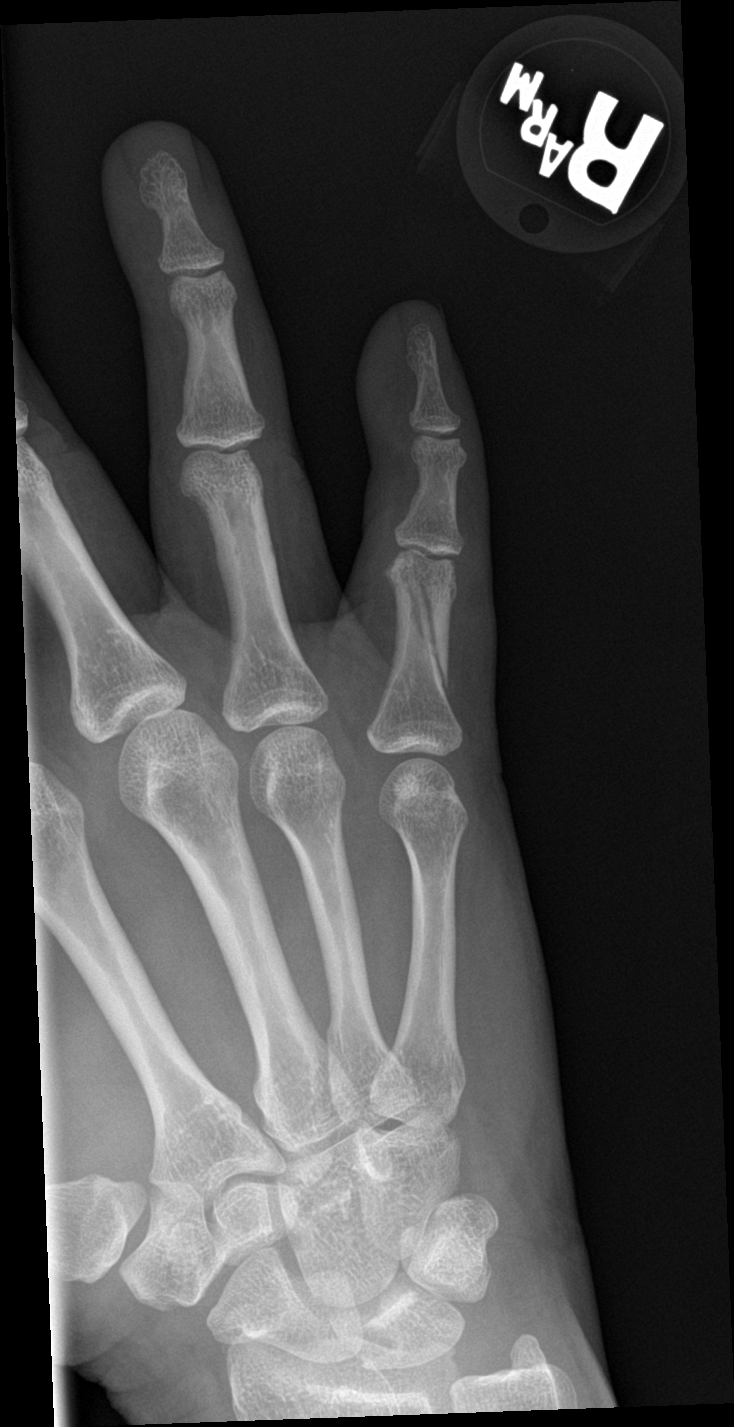

[finger lat]
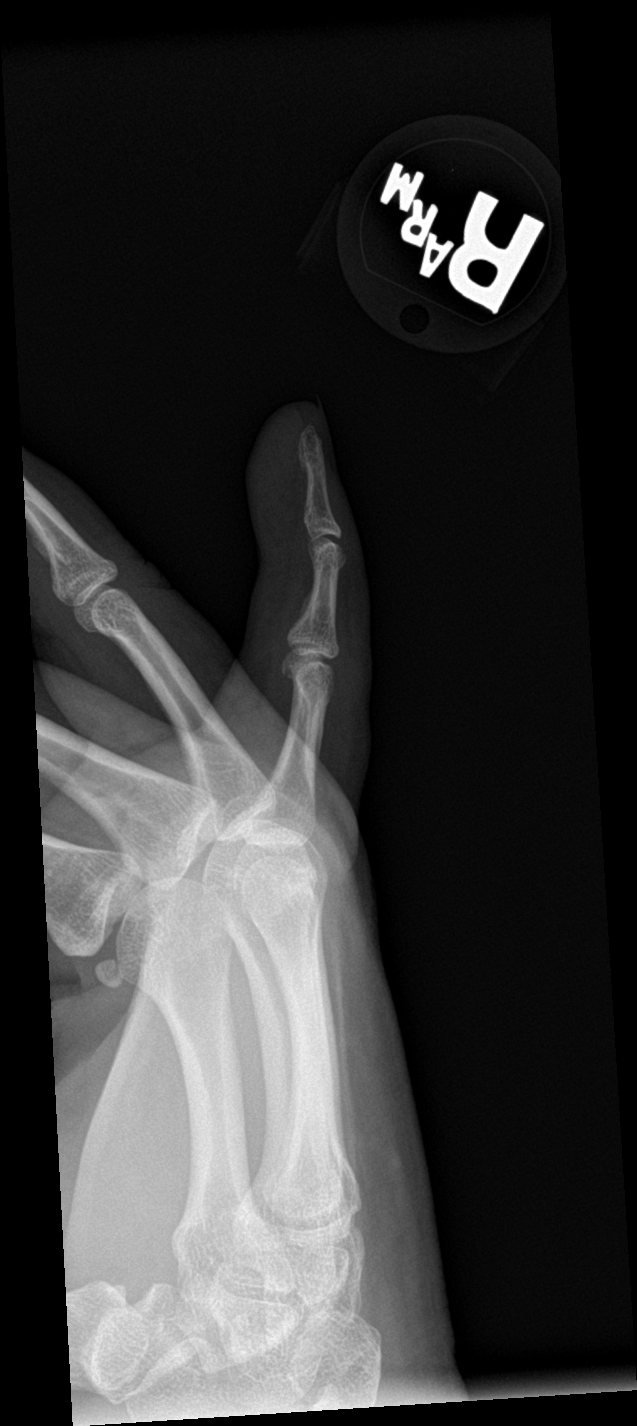

[3 of 3 positions shown; findings below may reference images not displayed]

FINDINGS: Oblique fracture through the proximal fifth phalanx again noted.
Slight displacement of the distal fragment when compared to prior
study. No subluxation or dislocation.
IMPRESSION: Continued oblique fracture through the right fifth proximal phalanx
with slight interval displacement. No evidence of healing.

## 2018-12-09 DIAGNOSIS — Z03818 Encounter for observation for suspected exposure to other biological agents ruled out: Secondary | ICD-10-CM | POA: Diagnosis not present

## 2018-12-09 DIAGNOSIS — R05 Cough: Secondary | ICD-10-CM | POA: Diagnosis not present

## 2018-12-27 ENCOUNTER — Emergency Department
Admission: EM | Admit: 2018-12-27 | Discharge: 2018-12-27 | Disposition: A | Discharge status: Home / Self Care | Payer: BLUE CROSS/BLUE SHIELD | Source: Home / Self Care | Attending: Family Medicine | Admitting: Family Medicine

## 2018-12-27 ENCOUNTER — Other Ambulatory Visit: Payer: Self-pay

## 2018-12-27 DIAGNOSIS — W57XXXA Bitten or stung by nonvenomous insect and other nonvenomous arthropods, initial encounter: Secondary | ICD-10-CM | POA: Diagnosis not present

## 2018-12-27 DIAGNOSIS — Z23 Encounter for immunization: Secondary | ICD-10-CM | POA: Diagnosis not present

## 2018-12-27 DIAGNOSIS — S20369A Insect bite (nonvenomous) of unspecified front wall of thorax, initial encounter: Secondary | ICD-10-CM | POA: Diagnosis not present

## 2018-12-27 DIAGNOSIS — L03313 Cellulitis of chest wall: Secondary | ICD-10-CM

## 2018-12-27 MED ORDER — DOXYCYCLINE HYCLATE 100 MG PO CAPS: 100.0000 mg | ORAL_CAPSULE | Freq: Two times a day (BID) | ORAL | 0 refills | Status: AC

## 2018-12-27 MED ORDER — TETANUS-DIPHTH-ACELL PERTUSSIS 5-2.5-18.5 LF-MCG/0.5 IM SUSP
0.5000 mL | Freq: Once | INTRAMUSCULAR | Status: AC
  Administered 2018-12-27: 0.5 mL via INTRAMUSCULAR

## 2018-12-27 NOTE — Discharge Instructions (Addendum)
Contact a health care provider if your symptoms do not begin to improve within 1-2 days of starting treatment or your symptoms get worse.

## 2018-12-27 NOTE — ED Provider Notes (Signed)
[facility]    CSN: [id number] Arrival date & time: 12/27/18  1847     History   Chief Complaint Chief Complaint  Patient presents with  . Tick Removal    HPI Ashlee Lyons is a 48 y.o. female.   Patient reports finding an embeded tick on her right chest inferior to the axilla today.  She believes that it may have been present for 3 days, although it was not engorged.  She states that her husband removed the tick intact.  She states that the area has been itching.  She reports a mild headache and dizziness during the past two days.  She denies fever and rash.  The history is provided by the patient.    Past Medical History:  Diagnosis Date  . Multiple sclerosis [facility])     Patient Active Problem List   Diagnosis Date Noted  . Finger fracture, right 05/23/2016    Past Surgical History:  Procedure Laterality Date  . CHOLECYSTECTOMY    . TUBAL LIGATION      OB History   No obstetric history on file.      Home Medications    Prior to Admission medications   Medication Sig Start Date End Date Taking? Authorizing Provider  Acetaminophen (TYLENOL) 325 MG CAPS Take by mouth.    [provider]  doxycycline (VIBRAMYCIN) 100 MG capsule Take 1 capsule (100 mg total) by mouth 2 (two) times daily. Take with food. 12/27/18   Lattie Haw, MD  VITAMIN D, CHOLECALCIFEROL, PO Take by mouth.    [provider]    Family History Family History  Problem Relation Age of Onset  . Rheum arthritis Father   . Diverticulitis Father   . COPD Father     Social History Social History   Tobacco Use  . Smoking status: Never Smoker  . Smokeless tobacco: Never Used  Substance Use Topics  . Alcohol use: No  . Drug use: No     Allergies   Patient has no known allergies.   Review of Systems Review of Systems  Constitutional: Negative for activity change, appetite change, chills, diaphoresis, fatigue and fever.  HENT: Negative.   Eyes:  Negative.   Respiratory: Negative.   Cardiovascular: Negative.   Gastrointestinal: Negative.   Genitourinary: Negative.   Musculoskeletal: Negative.   Skin: Positive for color change and wound.  Neurological: Positive for dizziness and headaches. Negative for light-headedness.     Physical Exam Triage Vital Signs ED Triage Vitals [12/27/18 1911]  Enc Vitals Group     BP 137/85     Pulse Rate 75     Resp 20     Temp 99.6 F (37.6 C)     Temp Source Tympanic     SpO2 99 %     Weight 207 lb (93.9 kg)     Height 5\' 4"  (1.626 m)     Head Circumference      Peak Flow      Pain Score 0     Pain Loc      Pain Edu?      Excl. in GC?    No data found.  Updated Vital Signs BP 137/85 (BP Location: Right Arm)   Pulse 75   Temp 99.6 F (37.6 C) (Tympanic)   Resp 20   Ht 5\' 4"  (1.626 m)   Wt 93.9 kg   LMP 11/26/2018   SpO2 99%   BMI 35.53 kg/m   Visual Acuity  Right Eye Distance:   Left Eye Distance:   Bilateral Distance:    Right Eye Near:   Left Eye Near:    Bilateral Near:     Physical Exam Vitals signs and nursing note reviewed.  Constitutional:      General: She is not in acute distress.    Appearance: She is obese.  HENT:     Head: Normocephalic.     Right Ear: Tympanic membrane normal.     Left Ear: Tympanic membrane normal.     Nose: Nose normal.     Mouth/Throat:     Mouth: Mucous membranes are moist.  Eyes:     Pupils: Pupils are equal, round, and reactive to light.  Cardiovascular:     Rate and Rhythm: Normal rate.  Pulmonary:     Effort: Pulmonary effort is normal.  Lymphadenopathy:     Cervical: No cervical adenopathy.  Skin:    General: Skin is warm and dry.     Comments: On the right lateral chest wall beneath axilla is a 2.5cm diameter patch of erythema surrounding evidence of a tick bite.  No mouth parts observed.  No induration or fluctuance.  Neurological:     Mental Status: She is alert and oriented to person, place, and time.       UC Treatments / Results  Labs (all labs ordered are listed, but only abnormal results are displayed) Labs Reviewed - No data to display  EKG None  Radiology No results found.  Procedures Procedures (including critical care time)  Medications Ordered in UC Medications  Tdap (BOOSTRIX) injection 0.5 mL (has no administration in time range)    Initial Impression / Assessment and Plan / UC Course  I have reviewed the triage vital signs and the nursing notes.  Pertinent labs & imaging results that were available during my care of the patient were reviewed by me and considered in my medical decision making (see chart for details).    Administered Tdap  Begin doxycycline for coverage of tick borne disease and staph.    Final Clinical Impressions(s) / UC Diagnoses   Final diagnoses:  Tick bite of chest wall, initial encounter  Cellulitis of chest wall     Discharge Instructions     Contact a health care provider if your symptoms do not begin to improve within 1-2 days of starting treatment or your symptoms get worse.    ED Prescriptions    Medication Sig Dispense Auth. Provider   doxycycline (VIBRAMYCIN) 100 MG capsule Take 1 capsule (100 mg total) by mouth 2 (two) times daily. Take with food. 20 capsule Lattie Haw, MD         Lattie Haw, MD 12/30/18 1536

## 2018-12-27 NOTE — ED Triage Notes (Signed)
Pt removed a tick from her right shoulder/underarm area today.  She thinks it has been there since Friday.  Pt states that area has itched since Friday.  Has headache and dizziness this weekend.

## 2018-12-29 ENCOUNTER — Telehealth: Payer: Self-pay

## 2018-12-29 NOTE — Telephone Encounter (Signed)
Left voice message inquiring about patients status. Encouraged patient to call with questions or concerns.  

## 2019-05-09 DIAGNOSIS — H5203 Hypermetropia, bilateral: Secondary | ICD-10-CM | POA: Diagnosis not present

## 2020-05-08 ENCOUNTER — Other Ambulatory Visit: Payer: Self-pay

## 2020-05-08 ENCOUNTER — Emergency Department
Admission: EM | Admit: 2020-05-08 | Discharge: 2020-05-08 | Disposition: A | Discharge status: Home / Self Care | Payer: BLUE CROSS/BLUE SHIELD | Source: Home / Self Care

## 2020-05-08 ENCOUNTER — Emergency Department: Admit: 2020-05-08 | Discharge status: Absent without leave | Payer: Self-pay

## 2020-05-08 DIAGNOSIS — H18821 Corneal disorder due to contact lens, right eye: Secondary | ICD-10-CM | POA: Diagnosis not present

## 2020-05-08 MED ORDER — GENTAMICIN SULFATE 0.3 % OP SOLN: 2.0000 [drp] | Freq: Four times a day (QID) | OPHTHALMIC | 0 refills | Status: AC

## 2020-05-08 NOTE — Discharge Instructions (Signed)
  Call to schedule a follow up appointment with your eye specialist at [facility] if not improving in 2-3 days, sooner if worsening.  Avoid using your contacts until symptoms resolve, then start with a new pair.

## 2020-05-08 NOTE — ED Provider Notes (Signed)
[facility]    CSN: [id number] Arrival date & time: 05/08/20  1241      History   Chief Complaint Chief Complaint  Patient presents with  . Eye Pain    HPI Judi Jaffe is a 49 y.o. female.   HPI  Zulay Corrie is a 49 y.o. female presenting to UC with c/o Right eye pain that started yesterday, associated itching. She does wear contacts and stopped wearing them yesterday due to the irritation.  She did try some saline drops last night but that worsened her symptoms.  Denies fever, chills, cough, ear pain, or sore throat. Mild congestion. Hx of seasonal allergies. She goes to [facility] for her eye examines; her annual exam is in October.   Past Medical History:  Diagnosis Date  . Multiple sclerosis [facility])     Patient Active Problem List   Diagnosis Date Noted  . Finger fracture, right 05/23/2016    Past Surgical History:  Procedure Laterality Date  . CHOLECYSTECTOMY    . TUBAL LIGATION      OB History   No obstetric history on file.      Home Medications    Prior to Admission medications   Medication Sig Start Date End Date Taking? Authorizing Provider  Acetaminophen (TYLENOL) 325 MG CAPS Take by mouth.    [provider]  doxycycline (VIBRAMYCIN) 100 MG capsule Take 1 capsule (100 mg total) by mouth 2 (two) times daily. Take with food. 12/27/18   Lattie Haw, MD  gentamicin (GARAMYCIN) 0.3 % ophthalmic solution Place 2 drops into the right eye 4 (four) times daily for 5 days. 05/08/20 05/13/20  Lurene Shadow, PA-C  VITAMIN D, CHOLECALCIFEROL, PO Take by mouth.    [provider]    Family History Family History  Problem Relation Age of Onset  . Rheum arthritis Father   . Diverticulitis Father   . COPD Father   . Hypertension Father     Social History Social History   Tobacco Use  . Smoking status: Never Smoker  . Smokeless tobacco: Never Used  Substance Use Topics  . Alcohol use: Yes    Alcohol/week: 1.0  standard drink    Types: 1 Standard drinks or equivalent per week    Comment: occ  . Drug use: No     Allergies   Patient has no known allergies.   Review of Systems Review of Systems  Constitutional: Negative for chills and fever.  HENT: Positive for congestion. Negative for ear pain, sinus pressure and sinus pain.   Eyes: Positive for photophobia, pain, discharge (watery) and itching. Negative for visual disturbance.  Respiratory: Negative for cough.   Neurological: Negative for dizziness, light-headedness and headaches.     Physical Exam Triage Vital Signs ED Triage Vitals  Enc Vitals Group     BP 05/08/20 1255 (!) 164/91     Pulse Rate 05/08/20 1255 94     Resp 05/08/20 1255 15     Temp 05/08/20 1255 99.4 F (37.4 C)     Temp Source 05/08/20 1255 Oral     SpO2 05/08/20 1255 100 %     Weight --      Height --      Head Circumference --      Peak Flow --      Pain Score 05/08/20 1254 2     Pain Loc --      Pain Edu? --      Excl. in  GC? --    No data found.  Updated Vital Signs BP (!) 164/91 (BP Location: Right Arm)   Pulse 94   Temp 99.4 F (37.4 C) (Oral)   Resp 15   SpO2 100%   Visual Acuity Right Eye Distance: 20/40 (corrected) Left Eye Distance: 20/40 (corrected) Bilateral Distance: 20/30 (corrected)  Right Eye Near:   Left Eye Near:    Bilateral Near:     Physical Exam Vitals and nursing note reviewed.  Constitutional:      General: She is not in acute distress.    Appearance: Normal appearance. She is well-developed. She is not ill-appearing, toxic-appearing or diaphoretic.  HENT:     Head: Normocephalic and atraumatic.     Right Ear: Tympanic membrane and ear canal normal.     Left Ear: Tympanic membrane and ear canal normal.     Nose: Nose normal.     Right Sinus: No maxillary sinus tenderness or frontal sinus tenderness.     Left Sinus: No maxillary sinus tenderness or frontal sinus tenderness.     Mouth/Throat:     Lips: Pink.      Mouth: Mucous membranes are moist.     Pharynx: Oropharynx is clear. Uvula midline.  Eyes:     General: Lids are normal. Lids are everted, no foreign bodies appreciated. Vision grossly intact. Gaze aligned appropriately.        Right eye: No foreign body, discharge or hordeolum.        Left eye: No foreign body, discharge or hordeolum.     Extraocular Movements: Extraocular movements intact.     Conjunctiva/sclera: Conjunctivae normal.      Comments: Small area of fluorescein uptake in center of cornea c/w abrasion  Cardiovascular:     Rate and Rhythm: Normal rate and regular rhythm.  Pulmonary:     Effort: Pulmonary effort is normal. No respiratory distress.     Breath sounds: Normal breath sounds.  Musculoskeletal:        General: Normal range of motion.     Cervical back: Normal range of motion.  Skin:    General: Skin is warm and dry.  Neurological:     Mental Status: She is alert and oriented to person, place, and time.  Psychiatric:        Behavior: Behavior normal.      UC Treatments / Results  Labs (all labs ordered are listed, but only abnormal results are displayed) Labs Reviewed - No data to display  EKG   Radiology No results found.  Procedures Procedures (including critical care time)  Medications Ordered in UC Medications - No data to display  Initial Impression / Assessment and Plan / UC Course  I have reviewed the triage vital signs and the nursing notes.  Pertinent labs & imaging results that were available during my care of the patient were reviewed by me and considered in my medical decision making (see chart for details).     Hx and exam c/w corneal abrasion, likely from contact use Will tx with gentamicin  F/u with her eye specialist for further evaluation and treatment AVS given  Final Clinical Impressions(s) / UC Diagnoses   Final diagnoses:  Corneal abrasion of right eye due to contact lens     Discharge Instructions      Call  to schedule a follow up appointment with your eye specialist at [facility] vision if not improving in 2-3 days, sooner if worsening.  Avoid using your contacts until  symptoms resolve, then start with a new pair.     ED Prescriptions    Medication Sig Dispense Auth. Provider   gentamicin (GARAMYCIN) 0.3 % ophthalmic solution Place 2 drops into the right eye 4 (four) times daily for 5 days. 5 mL Lurene Shadow, PA-C     PDMP not reviewed this encounter.   Lurene Shadow, [place] 05/08/20 1359

## 2020-05-08 NOTE — ED Triage Notes (Signed)
Patient presents to Urgent Care with complaints of right eye pain and itchiness since yesterday. Patient reports she put some drops in it before going to sleep last night and it was worse when she woke up this morning, pt does normally wear contacts, no contacts in place at this time.
